# Patient Record
Sex: Female | Born: 1970 | Race: White | Hispanic: No | Marital: Married | Smoking: Former smoker
Health system: Southern US, Community
[De-identification: ages and names within clinical notes are randomized; demographics above are authoritative.]

---

## 2015-05-29 LAB — HM PAP SMEAR: HM PAP: NEGATIVE

## 2016-05-18 ENCOUNTER — Encounter: Payer: Self-pay | Admitting: Family Medicine

## 2016-05-18 ENCOUNTER — Ambulatory Visit (INDEPENDENT_AMBULATORY_CARE_PROVIDER_SITE_OTHER): Payer: BLUE CROSS/BLUE SHIELD | Admitting: Family Medicine

## 2016-05-18 ENCOUNTER — Ambulatory Visit (INDEPENDENT_AMBULATORY_CARE_PROVIDER_SITE_OTHER)
Admission: RE | Admit: 2016-05-18 | Discharge: 2016-05-18 | Disposition: A | Discharge status: Home / Self Care | Payer: BLUE CROSS/BLUE SHIELD | Source: Ambulatory Visit | Attending: Family Medicine | Admitting: Family Medicine

## 2016-05-18 VITALS — BP 140/88 | HR 96 | Temp 98.3°F | Resp 12 | Ht 65.5 in | Wt 148.5 lb

## 2016-05-18 DIAGNOSIS — M7989 Other specified soft tissue disorders: Secondary | ICD-10-CM | POA: Diagnosis not present

## 2016-05-18 DIAGNOSIS — G8929 Other chronic pain: Secondary | ICD-10-CM

## 2016-05-18 DIAGNOSIS — M545 Low back pain, unspecified: Secondary | ICD-10-CM

## 2016-05-18 DIAGNOSIS — D239 Other benign neoplasm of skin, unspecified: Secondary | ICD-10-CM | POA: Diagnosis not present

## 2016-05-18 DIAGNOSIS — M799 Soft tissue disorder, unspecified: Secondary | ICD-10-CM

## 2016-05-18 NOTE — Progress Notes (Signed)
Pre visit review using our clinic review tool, if applicable. No additional management support is needed unless otherwise documented below in the visit note. 

## 2016-05-18 NOTE — Progress Notes (Signed)
HPI:   [name] is a 45 y.o. female, who is here today to establish care with me.  Former PCP: N/A Last preventive routine visit: gyn preventive visit 2016 and has one last week of this month.  Otherwise healthy, she follows a healthful diet, she does not exercise regularly.  Concerns today: back pain and "cyst.  For about a year she has had intermittent left-sided lower back pain, usually it is exacerbated by yard work activities, nothing specific, and alleviated by rest. She has not tried OTC medications because usually it resolves spontaneously. Problem seems to be stable and if she doesn't engage in yardwork she does not have pain.  She denies any Hx injury or unusual level of activity.  Pain is not radiated, like "a catch" on her left lower back, then achy, 4-5/10 in intensity, with no associated LE numbness, tingling, urinary incontinence or retention, stool incontinence, or saddle anesthesia.   No rash or edema on area, fever, chills, or abnormal wt loss.   -She is also concerned because about 2 months ago she noted a "bump" on same area she has back pain while she was dropping/massaging back. She is not sure about growth, sometimes it hurts upon palpation, she is afraid that this lesion might be the reason she is having back pain.  She is currently followed recently with orthopedist because left ankle sprain. A couple weeks ago she tripped while she was putting [employer], she is tolerating a short prior to and was supposed to follow up with orthopedist yesterday but canceled appointment because she does not feel like something else can be done at this point and recovering well.   She also noted a lesion, "cyst", on bottom of her left foot, usually doesn't hurt, and has been stable for months now. She wonders if this is related with lesion on her back.      Review of Systems  Constitutional: Negative for activity change, appetite change,  fatigue, fever and unexpected weight change.  HENT: Negative for mouth sores, sore throat and trouble swallowing.   Respiratory: Negative for cough, shortness of breath and wheezing.   Cardiovascular: Negative for leg swelling.  Gastrointestinal: Negative for abdominal pain, blood in stool, nausea and vomiting.       Negative for changes in bowel habits or fecal incontinence.  Genitourinary: Negative for decreased urine volume, difficulty urinating, dysuria, hematuria, vaginal bleeding and vaginal discharge.       Negative for urine incontinence.  Musculoskeletal: Positive for back pain. Negative for arthralgias, joint swelling and neck pain.  Skin: Negative for color change and rash.  Neurological: Negative for weakness, numbness and headaches.  Hematological: Negative for adenopathy. Does not bruise/bleed easily.  Psychiatric/Behavioral: Negative for confusion. The patient is nervous/anxious.       No current outpatient prescriptions on file prior to visit.   No current facility-administered medications on file prior to visit.      No past medical history on file.: Non contributory. No Known Allergies  Family History  Problem Relation Age of Onset  . Arthritis Mother   . Hyperlipidemia Mother   . Hypertension Mother   . Arthritis Father   . Hyperlipidemia Father   . Hypertension Father     Social History   Social History  . Marital status: Married    Spouse name: N/A  . Number of children: N/A  . Years of education: N/A   Social History Main Topics  . Smoking status:  Former Smoker  . Smokeless tobacco: Never Used  . Alcohol use Yes  . Drug use: No  . Sexual activity: Yes   Other Topics Concern  . None   Social History Narrative  . None    Vitals:   05/18/16 0903  BP: 140/88  Pulse: 96  Resp: 12  Temp: 98.3 F (36.8 C)    Body mass index is 24.34 kg/m.      Physical Exam  Nursing note and vitals reviewed. Constitutional: She is oriented to  person, place, and time. She appears well-developed and well-nourished. She does not appear ill. No distress.  HENT:  Head: Atraumatic.  Mouth/Throat: Oropharynx is clear and moist and mucous membranes are normal.  Eyes: Conjunctivae and EOM are normal. Pupils are equal, round, and reactive to light.  Cardiovascular: Normal rate and regular rhythm.   No murmur heard. Respiratory: Effort normal and breath sounds normal. No respiratory distress.  GI: Soft. She exhibits no mass. There is no hepatomegaly. There is no tenderness.  Musculoskeletal: She exhibits no edema.  No significant deformity appreciated. + Left SI joint tenderness upon palpation, no pain of paraspinal muscles. Pain is not elicited with movement on exam table during examination. No local edema or erythema appreciated, no suspicious lesions.  Left foot ecchymosis toes and lateral malleolus edema Plantar aspect left foot and mid aspect medial there is a rounded lesion, 4-5 mm, mildly tender, more pronounce with dorsal flexion on big toe, not mobile.  Neurological: She is alert and oriented to person, place, and time. She has normal strength. Coordination and gait normal.  SLR negative bilateral.  Skin: Skin is warm. No rash noted. No erythema.     Lesion of concern left lower back a very mobile, rounded, defined borders nodular lesion, mildly tender, about 2.5 cm. Noted similar lesions on right lower back area, sacral, x 2 (1 cm and 5 mm), no tender. No skin changes.  Psychiatric: Her mood appears anxious.  Well groomed, good eye contact.      ASSESSMENT AND PLAN:     Colleen Herrera was seen today for establish care.  Diagnoses and all orders for this visit:    Chronic left-sided low back pain without sciatica  Possible causes discussed, musculoskeletal  most likely. Pain is localized on SI joint pain, explained that sometimes steroid injections in this joint may help and it is an option if pain gets worse, done by  ortho. . Because she is concerned about pain and relation to small mass in same area, plain imaging ordered. She can try OTC analgesics: patch or NSAID's as needed. Most important is to avoid activities that exacerbate pain. F/U as needed.   -     DG Lumbar Spine Complete; Future  Benign neoplasm of skin or subcutaneous tissue  ? Lipoma. Reassured, I do not think it is related to back pain and features do not suggest malignancy. She would like to have it remove or aspirated. Referral to surgery for consultation will be arranged.  -     Ambulatory referral to General Surgery  Soft tissue lesion of foot  ? Fibroma. Eventually lesion may need to be remove because may grow and cause discomfort with shoe. She will continue monitoring. F/U as needed.   -BP mildly elevated today, she reports BP at home 100/70. -Keep appt with her gynecologists for her female preventive care. -Consider a physical with fasting labs in a year or so.       Betty G. Martinique,  MD  [facility]. [place] office.

## 2016-05-18 NOTE — Patient Instructions (Addendum)
A few things to remember from today's visit:   Chronic left-sided low back pain without sciatica - Plan: DG Lumbar Spine Complete  Benign neoplasm of skin or subcutaneous tissue - Plan: Ambulatory referral to General Surgery  Over the counter Lidocaine patch may help. Be careful with activities that aggravate pain.  Ortho sometimes may give an injection in sacro iliac joint when pain seems to be localized.   Lesion on lower back seems a lipoma.  Please be sure medication list is accurate. If a new problem present, please set up appointment sooner than planned today.

## 2016-06-09 LAB — HM MAMMOGRAPHY

## 2017-05-09 ENCOUNTER — Ambulatory Visit: Payer: BLUE CROSS/BLUE SHIELD

## 2017-06-17 ENCOUNTER — Encounter: Payer: Self-pay | Admitting: *Deleted

## 2018-10-09 IMAGING — DX DG LUMBAR SPINE COMPLETE 4+V
5 series · 5 of 5 positions shown · non-contrast
Comparison: None in PACs

CLINICAL DATA: Chronic low back pain without sciatic symptoms.
Tenderness is localized over the SI joint on exam. No known injury.

EXAM:
LUMBAR SPINE - COMPLETE 4+ VIEW

[l-spine ap]
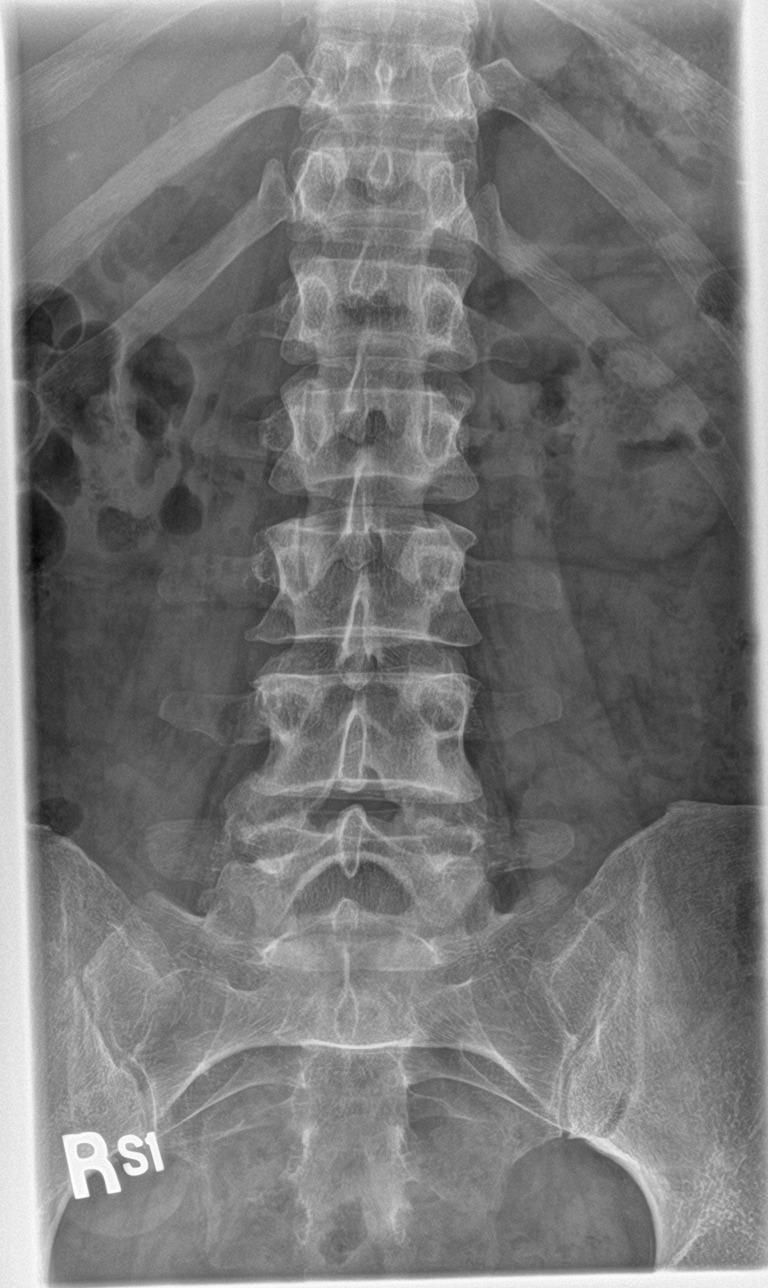

[l-spine obl (1 of 2)]
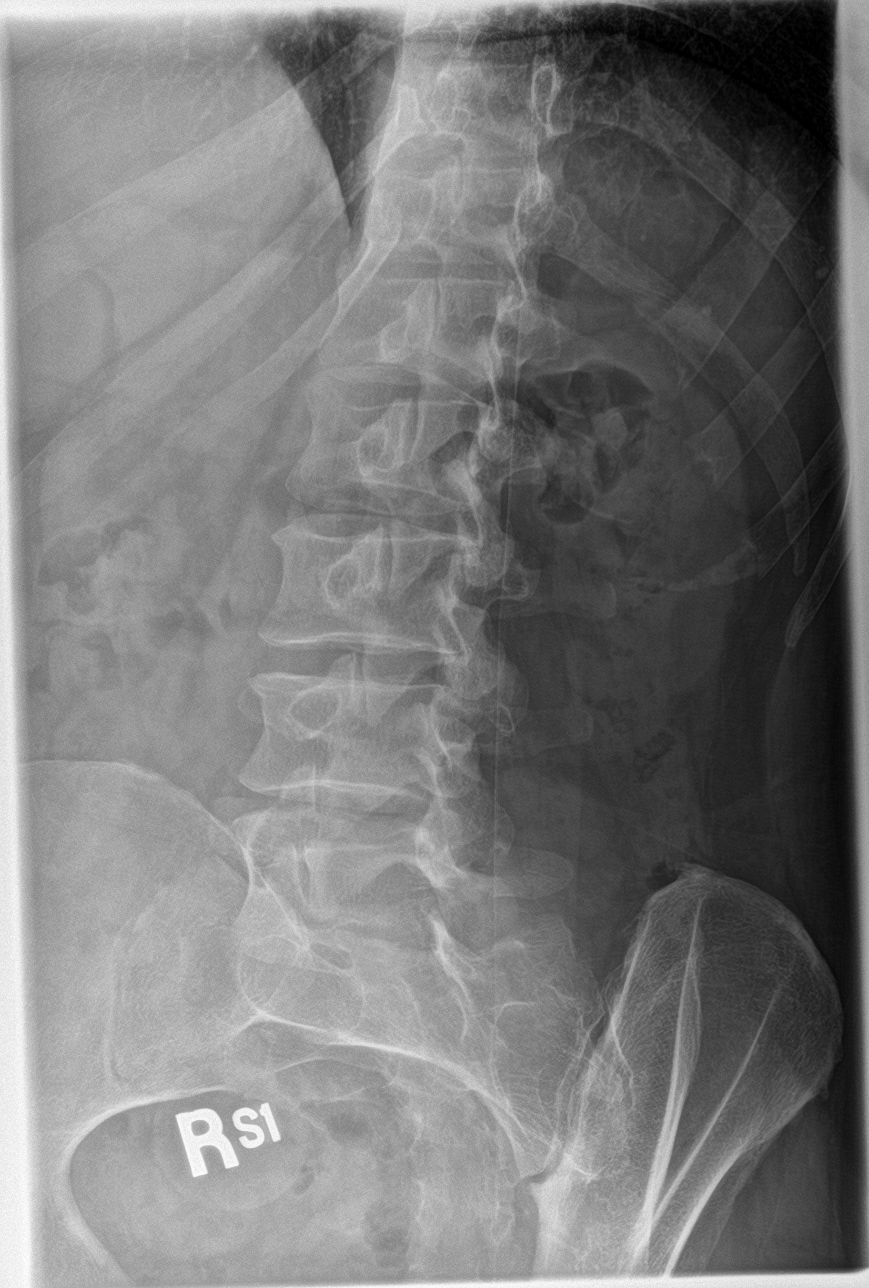

[l-spine obl (2 of 2)]
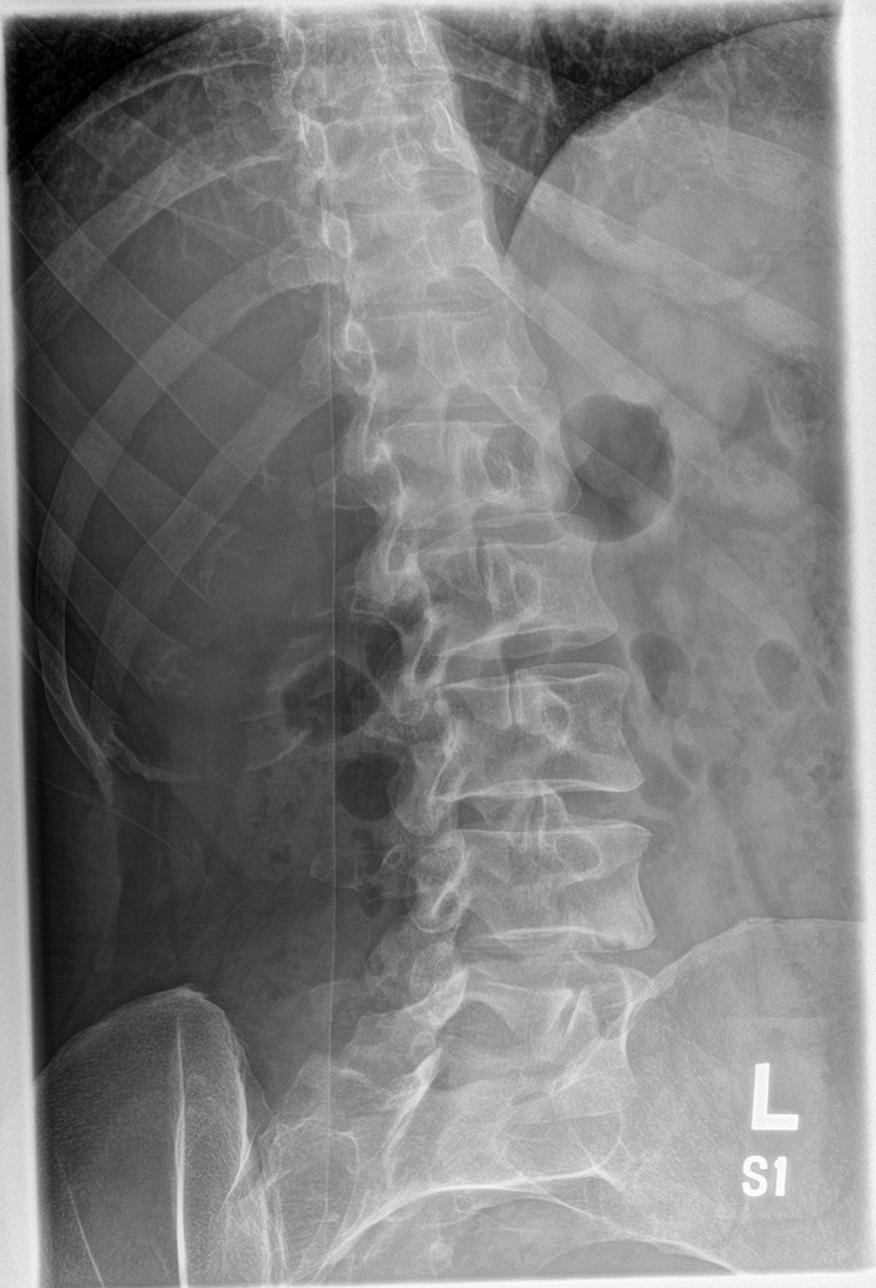

[l-spine lat]
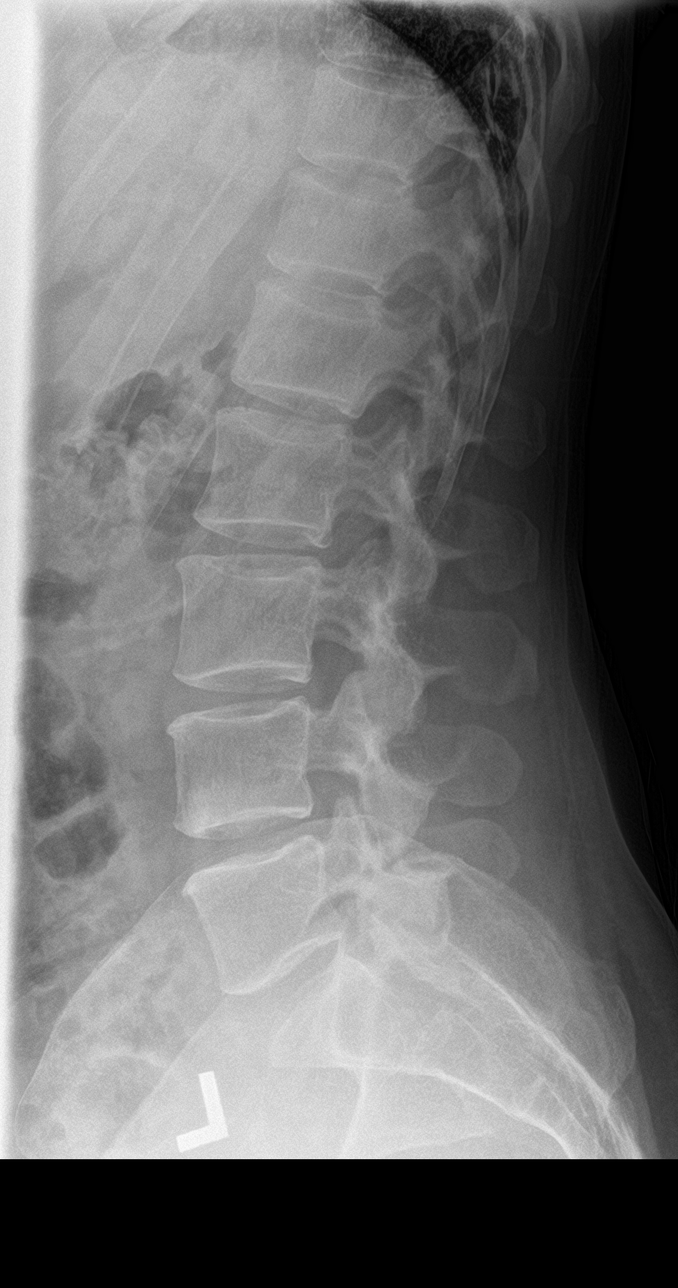

[l-spine spot]
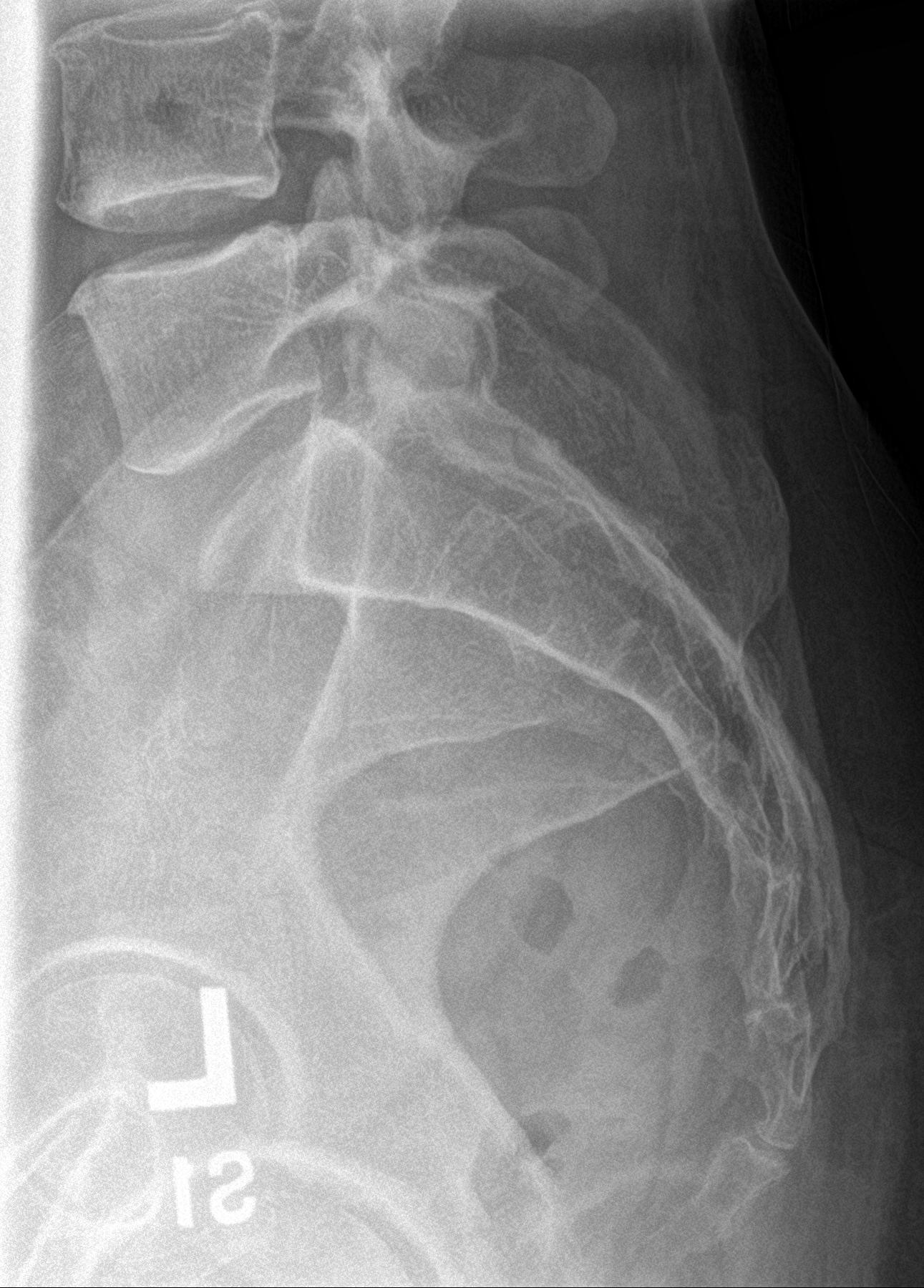

[5 of 5 positions shown; findings below may reference images not displayed]

FINDINGS: The lumbar vertebral bodies are preserved in height. The pedicles
and transverse processes are intact. The disc space heights are well
maintained. There is no significant facet joint hypertrophy. There
is no spondylolisthesis. The observed portions of the sacrum are
normal.
IMPRESSION: There is no acute or chronic bony abnormality of the lumbar spine.

## 2021-05-19 ENCOUNTER — Encounter (HOSPITAL_BASED_OUTPATIENT_CLINIC_OR_DEPARTMENT_OTHER): Payer: Self-pay | Admitting: Nurse Practitioner

## 2021-05-19 ENCOUNTER — Ambulatory Visit (INDEPENDENT_AMBULATORY_CARE_PROVIDER_SITE_OTHER): Payer: 59 | Admitting: Nurse Practitioner

## 2021-05-19 ENCOUNTER — Other Ambulatory Visit: Payer: Self-pay

## 2021-05-19 VITALS — BP 132/78 | HR 80 | Ht 65.0 in | Wt 145.6 lb

## 2021-05-19 DIAGNOSIS — Z23 Encounter for immunization: Secondary | ICD-10-CM

## 2021-05-19 DIAGNOSIS — Z13220 Encounter for screening for lipoid disorders: Secondary | ICD-10-CM

## 2021-05-19 DIAGNOSIS — Z8616 Personal history of COVID-19: Secondary | ICD-10-CM

## 2021-05-19 DIAGNOSIS — Z130 Encounter for screening for diseases of the blood and blood-forming organs and certain disorders involving the immune mechanism: Secondary | ICD-10-CM | POA: Diagnosis not present

## 2021-05-19 DIAGNOSIS — Z1329 Encounter for screening for other suspected endocrine disorder: Secondary | ICD-10-CM | POA: Diagnosis not present

## 2021-05-19 DIAGNOSIS — M722 Plantar fascial fibromatosis: Secondary | ICD-10-CM | POA: Insufficient documentation

## 2021-05-19 DIAGNOSIS — Z0000 Encounter for general adult medical examination without abnormal findings: Secondary | ICD-10-CM | POA: Diagnosis not present

## 2021-05-19 DIAGNOSIS — E559 Vitamin D deficiency, unspecified: Secondary | ICD-10-CM

## 2021-05-19 DIAGNOSIS — Z1211 Encounter for screening for malignant neoplasm of colon: Secondary | ICD-10-CM

## 2021-05-19 DIAGNOSIS — Z13228 Encounter for screening for other metabolic disorders: Secondary | ICD-10-CM

## 2021-05-19 DIAGNOSIS — Z1321 Encounter for screening for nutritional disorder: Secondary | ICD-10-CM

## 2021-05-19 MED ORDER — ZOSTER VAC RECOMB ADJUVANTED 50 MCG/0.5ML IM SUSR: 0.5000 mL | Freq: Once | INTRAMUSCULAR | 1 refills | Status: AC

## 2021-05-19 NOTE — Patient Instructions (Signed)
Thank you for choosing [place] at [facility] for your Primary Care needs. I am excited for the opportunity to partner with you to meet your health care goals. It was a pleasure meeting you today!  Recommendations from today's visit: I have sent the order to [employer] - this will be sent to your house and you will follow instructions to mail back to the company.  I have written the prescription for Shingrix. This can be taken to the pharmacy and completed. Be sure to ask about cost as some plans do not cover this well  I have ordered labs today. We will contact you with the results and let you know of any recommendations.  I recommend the COVID booster at any time.  Remember this takes two weeks to be fully active in your system so be safe until that time.  We have given you the tetanus booster today.   Information on diet, exercise, and health maintenance recommendations are listed below. This is information to help you be sure you are on track for optimal health and monitoring.   Please look over this and let us know if you have any questions or if you have completed any of the health maintenance outside of [place] so that we can be sure your records are up to date.  ___________________________________________________________ About Me: I am an Adult-Geriatric Nurse Practitioner with a background in caring for patients for more than 20 years with a strong intensive care background. I provide primary care and sports medicine services to patients age 70 and older within this office. My education had a strong focus on caring for the older adult population, which I am passionate about. I am also the director of the APP Fellowship with [facility] ([facility]   My desire is to provide you with the best service through preventive medicine and supportive care. I consider you a part of the medical team and value your input. I work diligently to ensure that you are heard and your needs are  met in a safe and effective manner. I want you to feel comfortable with me as your provider and want you to know that your health concerns are important to me.  For your information, our office hours are: Monday, Tuesday, and Thursday 8:00 AM - 5:00 PM Wednesday and Friday 8:00 AM - 12:00 PM.   In my time away from the office I am teaching new APP's within the system and am unavailable, but my partner, Dr. Burnard Bunting is in the office for emergent needs.   If you have questions or concerns, please call our office at [phone] or send [place] a MyChart message and we will respond as quickly as possible.  ____________________________________________________________ MyChart:  For all urgent or time sensitive needs we ask that you please call the office to avoid delays. Our number is [phone]. MyChart is not constantly monitored and due to the large volume of messages a day, replies may take up to 72 business hours.  MyChart Policy: MyChart allows for you to see your visit notes, after visit summary, provider recommendations, lab and tests results, make an appointment, request refills, and contact your provider or the office for non-urgent questions or concerns. Providers are seeing patients during normal business hours and do not have built in time to review MyChart messages.  We ask that you allow a minimum of 3 business days for responses to [employer]. For this reason, please do not send urgent requests through [place]. Please  call the office at [phone]. New and ongoing conditions may require a visit. We have virtual and in person visit available for your convenience.  Complex MyChart concerns may require a visit. Your provider may request you schedule a virtual or in person visit to ensure we are providing the best care possible. MyChart messages sent after 11:00 AM on Friday will not be received by the provider until Monday morning.    Lab and Test Results: You will receive your lab  and test results on MyChart as soon as they are completed and results have been sent by the lab or testing facility. Due to this service, you will receive your results BEFORE your provider.  I review lab and tests results each morning prior to seeing patients. Some results require collaboration with other providers to ensure you are receiving the most appropriate care. For this reason, we ask that you please allow a minimum of 3-5 business days from the time the ALL results have been received for your provider to receive and review lab and test results and contact you about these.  Most lab and test result comments from the provider will be sent through [place]. Your provider may recommend changes to the plan of care, follow-up visits, repeat testing, ask questions, or request an office visit to discuss these results. You may reply directly to this message or call the office at [phone] to provide information for the provider or set up an appointment. In some instances, you will be called with test results and recommendations. Please let us know if this is preferred and we will make note of this in your chart to provide this for you.    If you have not heard a response to your lab or test results in 5 business days from all results returning to [place], please call the office to let us know. We ask that you please avoid calling prior to this time unless there is an emergent concern. Due to high call volumes, this can delay the resulting process.  After Hours: For all non-emergency after hours needs, please call the office at [phone] and select the option to reach the on-call provider service. On-call services are shared between multiple [place] offices and therefore it will not be possible to speak directly with your provider. On-call providers may provide medical advice and recommendations, but are unable to provide refills for maintenance medications.  For all emergency or urgent medical needs  after normal business hours, we recommend that you seek care at the closest Urgent Care or Emergency Department to ensure appropriate treatment in a timely manner.  [facility] [place] has a 24 hour emergency room located on the ground floor for your convenience.   Urgent Concerns During the Business Day Providers are seeing patients from 8AM to [place] with a busy schedule and are most often not able to respond to non-urgent calls until the end of the day or the next business day. If you should have URGENT concerns during the day, please call and speak to the nurse or schedule a same day appointment so that we can address your concern without delay.   Thank you, again, for choosing me as your health care partner. I appreciate your trust and look forward to learning more about you.   Worthy Keeler, DNP, AGNP-c ___________________________________________________________  Health Maintenance Recommendations Screening Testing Mammogram Every 1 -2 years based on history and risk factors Starting at age 75 Pap Smear Ages 21-39 every 3 years Ages  30-65 every 5 years with HPV testing More frequent testing may be required based on results and history Colon Cancer Screening Every 1-10 years based on test performed, risk factors, and history Starting at age 49 Bone Density Screening Every 2-10 years based on history Starting at age 39 for women Recommendations for men differ based on medication usage, history, and risk factors AAA Screening One time ultrasound Men 36-67 years old who have every smoked Lung Cancer Screening Low Dose Lung CT every 12 months Age 8-80 years with a 30 pack-year smoking history who still smoke or who have quit within the last 15 years  Screening Labs Routine  Labs: Complete Blood Count (CBC), Complete Metabolic Panel (CMP), Cholesterol (Lipid Panel) Every 6-12 months based on history and medications May be recommended more frequently based on  current conditions or previous results Hemoglobin A1c Lab Every 3-12 months based on history and previous results Starting at age 31 or earlier with diagnosis of diabetes, high cholesterol, BMI >26, and/or risk factors Frequent monitoring for patients with diabetes to ensure blood sugar control Thyroid Panel (TSH w/ T3 & T4) Every 6 months based on history, symptoms, and risk factors May be repeated more often if on medication HIV One time testing for all patients 81 and older May be repeated more frequently for patients with increased risk factors or exposure Hepatitis C One time testing for all patients 30 and older May be repeated more frequently for patients with increased risk factors or exposure Gonorrhea, Chlamydia Every 12 months for all sexually active persons 13-24 years Additional monitoring may be recommended for those who are considered high risk or who have symptoms PSA Men 37-75 years old with risk factors Additional screening may be recommended from age 57-69 based on risk factors, symptoms, and history  Vaccine Recommendations Tetanus Booster All adults every 10 years Flu Vaccine All patients 6 months and older every year COVID Vaccine All patients 12 years and older Initial dosing with booster May recommend additional booster based on age and health history HPV Vaccine 2 doses all patients age 76-26 Dosing may be considered for patients over 26 Shingles Vaccine (Shingrix) 2 doses all adults 3 years and older Pneumonia (Pneumovax 23) All adults 37 years and older May recommend earlier dosing based on health history Pneumonia (Prevnar 45) All adults 10 years and older Dosed 1 year after Pneumovax 23  Additional Screening, Testing, and Vaccinations may be recommended on an individualized basis based on family history, health history, risk factors, and/or exposure.  __________________________________________________________  Diet Recommendations for All  Patients  I recommend that all patients maintain a diet low in saturated fats, carbohydrates, and cholesterol. While this can be challenging at first, it is not impossible and small changes can make big differences.  Things to try: Decreasing the amount of soda, sweet tea, and/or juice to one or less per day and replace with water While water is always the first choice, if you do not like water you may consider adding a water additive without sugar to improve the taste other sugar free drinks Replace potatoes with a brightly colored vegetable at dinner Use healthy oils, such as canola oil or olive oil, instead of butter or hard margarine Limit your bread intake to two pieces or less a day Replace regular pasta with low carb pasta options Bake, broil, or grill foods instead of frying Monitor portion sizes  Eat smaller, more frequent meals throughout the day instead of large meals  An important thing  to remember is, if you love foods that are not great for your health, you don't have to give them up completely. Instead, allow these foods to be a reward when you have done well. Allowing yourself to still have special treats every once in a while is a nice way to tell yourself thank you for working hard to keep yourself healthy.   Also remember that every day is a new day. If you have a bad day and "fall off the wagon", you can still climb right back up and keep moving along on your journey!  We have resources available to help you!  Some websites that may be helpful include: www.http://carter.biz/  Www.VeryWellFit.com _____________________________________________________________  Activity Recommendations for All Patients  I recommend that all adults get at least 20 minutes of moderate physical activity that elevates your heart rate at least 5 days out of the week.  Some examples include: Walking or jogging at a pace that allows you to carry on a conversation Cycling (stationary bike or  outdoors) Water aerobics Yoga Weight lifting Dancing If physical limitations prevent you from putting stress on your joints, exercise in a pool or seated in a chair are excellent options.  Do determine your MAXIMUM heart rate for activity: YOUR AGE - 220 = MAX HeartRate   Remember! Do not push yourself too hard.  Start slowly and build up your pace, speed, weight, time in exercise, etc.  Allow your body to rest between exercise and get good sleep. You will need more water than normal when you are exerting yourself. Do not wait until you are thirsty to drink. Drink with a purpose of getting in at least 8, 8 ounce glasses of water a day plus more depending on how much you exercise and sweat.    If you begin to develop dizziness, chest pain, abdominal pain, jaw pain, shortness of breath, headache, vision changes, lightheadedness, or other concerning symptoms, stop the activity and allow your body to rest. If your symptoms are severe, seek emergency evaluation immediately. If your symptoms are concerning, but not severe, please let us know so that we can recommend further evaluation.

## 2021-05-19 NOTE — Assessment & Plan Note (Signed)
No concerns or complaints today. Up-to-date on her eye exam. Tetanus shot- Will give today. States her last one was over ten years ago.  Recommend Shingrix vaccine due to her age; prescription given for her to take it to the pharmacy. Recommend Covid booster. Recommend Colorectal cancer screening due to her age. Patient agreeable to [facility]. Referral sent and information pamphlet given to the patient.

## 2021-05-19 NOTE — Progress Notes (Signed)
New Patient Office Visit  Subjective:  Patient ID: Colleen Herrera, female    DOB: 1971/04/16  Age: 50 y.o. MRN: [id number]  CC:  Chief Complaint  Patient presents with   Establish Care    No prior pcp in the last 3 years. Patient has been following with [facility]. She is up to date on her flu and mammogram. She has requested to have a colonoscopy ordered    HPI Colleen Herrera presents for establishing care and her annual physical. States she exercises 3-4 times a week. She does body pump and treadmill. Her diet is vegetarian and pescatarian. She denies any unusual headaches, chest pain or shortness of breath. She has not had any recent lab work. She has a GYN at [facility] and is up-to-date on her pap smear and mammogram. Her last mammogram was six months ago.   History reviewed. No pertinent past medical history.  Past Surgical History:  Procedure Laterality Date   CESAREAN SECTION     2007    Family History  Problem Relation Age of Onset   Arthritis Mother    Hyperlipidemia Mother    Hypertension Mother    Arthritis Father    Hyperlipidemia Father    Hypertension Father    AAA (abdominal aortic aneurysm) Paternal Grandfather     Social History   Socioeconomic History   Marital status: Married    Spouse name: Not on file   Number of children: Not on file   Years of education: Not on file   Highest education level: Not on file  Occupational History   Not on file  Tobacco Use   Smoking status: Former   Smokeless tobacco: Never   Tobacco comments:    Smoked in college   Vaping Use   Vaping Use: Never used  Substance and Sexual Activity   Alcohol use: Yes    Comment: 3-4 x week\   Drug use: No   Sexual activity: Yes  Other Topics Concern   Not on file  Social History Narrative   Not on file   Social Determinants of Health   Financial Resource Strain: Not on file  Food Insecurity: Not on file  Transportation Needs: Not on file   Physical Activity: Not on file  Stress: Not on file  Social Connections: Not on file  Intimate Partner Violence: Not on file    ROS Review of Systems  All other systems reviewed and are negative.  Objective:   Today's Vitals: BP 132/78   Pulse 80   Ht 5\' 5"  (1.651 m)   Wt 145 lb 9.6 oz (66 kg)   LMP  (Within Years) Comment: approx 1 year ago  SpO2 98%   BMI 24.23 kg/m   Physical Exam Vitals and nursing note reviewed.  Constitutional:      Appearance: Normal appearance.  HENT:     Head: Normocephalic.     Right Ear: Tympanic membrane, ear canal and external ear normal.     Left Ear: Tympanic membrane and ear canal normal.     Nose: Nose normal.     Mouth/Throat:     Mouth: Mucous membranes are moist.     Pharynx: Oropharynx is clear.  Eyes:     Extraocular Movements: Extraocular movements intact.     Conjunctiva/sclera: Conjunctivae normal.     Pupils: Pupils are equal, round, and reactive to light.  Cardiovascular:     Rate and Rhythm: Normal rate and regular rhythm.  Pulses: Normal pulses.     Heart sounds: Normal heart sounds.  Pulmonary:     Effort: Pulmonary effort is normal.     Breath sounds: Normal breath sounds.  Abdominal:     General: Bowel sounds are normal.     Palpations: Abdomen is soft.  Musculoskeletal:        General: Normal range of motion.     Cervical back: Normal range of motion.     Comments: Small cysts on the bottom of her feet.  Skin:    General: Skin is warm and dry.     Capillary Refill: Capillary refill takes more than 3 seconds.  Neurological:     Mental Status: She is alert and oriented to person, place, and time.  Psychiatric:        Mood and Affect: Mood normal.        Behavior: Behavior normal.    Assessment & Plan:   Problem List Items Addressed This Visit       Musculoskeletal and Integument   Plantar fascial fibromatosis of both feet    Small cysts present on both of her feet. Non-tender. Will continue to  monitor them.         Other   Encounter for medical examination to establish care - Primary    No concerns or complaints today. Up-to-date on her eye exam. Tetanus shot- Will give today. States her last one was over ten years ago.  Recommend Shingrix vaccine due to her age; prescription given for her to take it to the pharmacy. Recommend Covid booster. Recommend Colorectal cancer screening due to her age. Patient agreeable to Cologuard. Referral sent and information pamphlet given to the patient.       Other Visit Diagnoses     Screening for deficiency anemia       Relevant Orders   CBC with Differential/Platelet   Screening for lipid disorders       Relevant Orders   Lipid panel   Screening for thyroid disorder       Relevant Orders   TSH Rfx on Abnormal to Free T4   Screening for endocrine, nutritional, metabolic and immunity disorder       Relevant Orders   CBC with Differential/Platelet   Comprehensive metabolic panel   Hemoglobin A1c   Lipid panel   TSH Rfx on Abnormal to Free T4   Screening for colon cancer       Relevant Orders   Cologuard   Need for tetanus booster       Relevant Orders   Td : Tetanus/diphtheria >7yo Preservative  free (Completed)   Need for shingles vaccine       Relevant Medications   Zoster Vaccine Adjuvanted [facility]) injection   Vitamin D deficiency       Relevant Orders   VITAMIN D 25 Hydroxy (Vit-D Deficiency, Fractures)   Personal history of COVID-19           Outpatient Encounter Medications as of 05/19/2021  Medication Sig   Zoster Vaccine Adjuvanted [facility]) injection Inject 0.5 mLs into the muscle once for 1 dose. Repeat in 2-6 months. Please fax confirmation of vaccination to Worthy Keeler, DNP at [phone]   No facility-administered encounter medications on file as of 05/19/2021.    Follow-up: Return in about 1 year (around 05/19/2022) for CPE.   Tinnie Gens, Toney Reil, DNP STUDENT    Patient seen along with NP  student, Tinnie Gens, RN at today's visit. Portions of documentation and  assessment have been completed by the student listed.  Porfirio Oar, NP, have reviewed all documentation completed by the student. The documentation on 05/19/21 for the exam, diagnosis, procedures, and orders are all accurate and complete.  Porfirio Oar, NP,have personally seen and evaluated the patient during this encounter.  While the patient was in clinic, I reviewed the patient's medical history, the student's findings on physical examination, and the patient's diagnosis and treatment plan. All aspects of care were discussed with the the student and I agree with the information documented.   Zoriyah Scheidegger, Coralee Pesa, NP, DNp, AGNP-c [facility]

## 2021-05-19 NOTE — Assessment & Plan Note (Signed)
Small cysts present on both of her feet. Non-tender. Will continue to monitor them.

## 2021-05-20 ENCOUNTER — Telehealth (HOSPITAL_BASED_OUTPATIENT_CLINIC_OR_DEPARTMENT_OTHER): Payer: Self-pay

## 2021-05-20 LAB — CBC WITH DIFFERENTIAL/PLATELET
Basophils Absolute: 0.1 10*3/uL (ref 0.0–0.2)
Basos: 1 %
EOS (ABSOLUTE): 0.1 10*3/uL (ref 0.0–0.4)
Eos: 1 %
Hematocrit: 44.7 % (ref 34.0–46.6)
Hemoglobin: 15.5 g/dL (ref 11.1–15.9)
Immature Grans (Abs): 0 10*3/uL (ref 0.0–0.1)
Immature Granulocytes: 0 %
Lymphocytes Absolute: 2.6 10*3/uL (ref 0.7–3.1)
Lymphs: 37 %
MCH: 32.5 pg (ref 26.6–33.0)
MCHC: 34.7 g/dL (ref 31.5–35.7)
MCV: 94 fL (ref 79–97)
Monocytes Absolute: 0.5 10*3/uL (ref 0.1–0.9)
Monocytes: 7 %
Neutrophils Absolute: 3.8 10*3/uL (ref 1.4–7.0)
Neutrophils: 54 %
Platelets: 319 10*3/uL (ref 150–450)
RBC: 4.77 x10E6/uL (ref 3.77–5.28)
RDW: 11.6 % — ABNORMAL LOW (ref 11.7–15.4)
WBC: 7 10*3/uL (ref 3.4–10.8)

## 2021-05-20 LAB — COMPREHENSIVE METABOLIC PANEL
ALT: 18 IU/L (ref 0–32)
AST: 19 IU/L (ref 0–40)
Albumin/Globulin Ratio: 2.3 — ABNORMAL HIGH (ref 1.2–2.2)
Albumin: 5.1 g/dL — ABNORMAL HIGH (ref 3.8–4.9)
Alkaline Phosphatase: 117 IU/L (ref 44–121)
BUN/Creatinine Ratio: 14 (ref 9–23)
BUN: 9 mg/dL (ref 6–24)
Bilirubin Total: 0.3 mg/dL (ref 0.0–1.2)
CO2: 27 mmol/L (ref 20–29)
Calcium: 9.9 mg/dL (ref 8.7–10.2)
Chloride: 101 mmol/L (ref 96–106)
Creatinine, Ser: 0.65 mg/dL (ref 0.57–1.00)
Globulin, Total: 2.2 g/dL (ref 1.5–4.5)
Glucose: 115 mg/dL — ABNORMAL HIGH (ref 70–99)
Potassium: 4.3 mmol/L (ref 3.5–5.2)
Sodium: 141 mmol/L (ref 134–144)
Total Protein: 7.3 g/dL (ref 6.0–8.5)
eGFR: 107 mL/min/{1.73_m2} (ref >59)

## 2021-05-20 LAB — LIPID PANEL
Chol/HDL Ratio: 2.6 ratio (ref 0.0–4.4)
Cholesterol, Total: 201 mg/dL — ABNORMAL HIGH (ref 100–199)
HDL: 77 mg/dL (ref >39)
LDL Chol Calc (NIH): 106 mg/dL — ABNORMAL HIGH (ref 0–99)
Triglycerides: 102 mg/dL (ref 0–149)
VLDL Cholesterol Cal: 18 mg/dL (ref 5–40)

## 2021-05-20 LAB — TSH RFX ON ABNORMAL TO FREE T4: TSH: 1.45 u[IU]/mL (ref 0.450–4.500)

## 2021-05-20 LAB — HEMOGLOBIN A1C
Est. average glucose Bld gHb Est-mCnc: 105 mg/dL
Hgb A1c MFr Bld: 5.3 % (ref 4.8–5.6)

## 2021-05-20 LAB — VITAMIN D 25 HYDROXY (VIT D DEFICIENCY, FRACTURES): Vit D, 25-Hydroxy: 41.4 ng/mL (ref 30.0–100.0)

## 2021-05-20 NOTE — Telephone Encounter (Signed)
-----   Message from Orma Render, NP sent at 05/20/2021  8:10 AM EST ----- CBC: Your red and white blood counts are normal. There is no sign of infection, There is no sign of anemia present.  Recommendations: Your RDW is just slightly low, but nothing concerning. We will monitor this in the future to see if it is trending, but with all of the other values within normal limits, no concerns at this time.   CMP: Your blood sugar is abnormal - it was higher than expected. If you were not fasting, this is normal and no concerns.  Your kidney function is normal. Your liver function is normal. Your electrolytes are normal. Recommendations: If you were fasting at the time of your labs, your blood sugar is higher than expected, but most likely you had eaten, which makes this value normal- especially with the A1c results (see below).  A1c: Your A1c:5.3 %. Your goal A1c is < 5.7%. Hemoglobin A1c gives [place] an average blood sugar over the last three months. This tells me your average blood sugar is 105 mg/dL. At this time, your blood sugars are well controlled.  Recommendations: No changes or concerns present. Keep up the great work.   Lipids (Cholesterol): Your overall cholesterol is abnormal - slightly elevated. Your LDL (bad cholesterol) is abnormal - slightly elevated. Your HDL (good cholesterol) is Fantastic! . Your triglycerides are normal. Recommendations: Your overall cholesterol does show elevated, but your good cholesterol levels are so high that this is bumping up the number. We would like your bad cholesterol to be a little lower (less than 100 is goal), but your good cholesterol helps to cancel this out and protect you against complications with high cholesterol. I recommend monitoring your saturated fats and increase fiber and fresh vegetables in your diet (if you don't already). No additional concerns or changes at this time.   Your thyroid testing is normal.  Recommendations: No changes or concerns at  this time.   Your vitamin D level is Normal.  Recommendations: No changes or concerns at this time.  Keep up the great work! Colleen Herrera

## 2021-05-20 NOTE — Telephone Encounter (Signed)
Patient is aware and agreeable to normal labs results Patient was surprised that cholesterol was abnormal being that she is a vegetarian and eats very clean I explained to her that overall her cholesterol and health is great, there is no need to make any drastic changes She was comforted and thanked me

## 2021-06-10 LAB — COLOGUARD: COLOGUARD: NEGATIVE

## 2021-06-10 NOTE — Progress Notes (Signed)
Cologuard results are normal. We will plan to repeat in 3 years unless there are any issues.   Have a great day! SaraBeth

## 2021-07-09 ENCOUNTER — Other Ambulatory Visit: Payer: Self-pay

## 2021-07-09 ENCOUNTER — Ambulatory Visit (INDEPENDENT_AMBULATORY_CARE_PROVIDER_SITE_OTHER): Payer: 59 | Admitting: Nurse Practitioner

## 2021-07-09 DIAGNOSIS — R300 Dysuria: Secondary | ICD-10-CM | POA: Diagnosis not present

## 2021-07-09 LAB — POCT URINALYSIS DIP (CLINITEK)
Bilirubin, UA: NEGATIVE
Glucose, UA: NEGATIVE mg/dL
Ketones, POC UA: NEGATIVE mg/dL
Nitrite, UA: NEGATIVE
POC PROTEIN,UA: NEGATIVE
Spec Grav, UA: 1.01 (ref 1.010–1.025)
Urobilinogen, UA: 0.2 E.U./dL
pH, UA: 5.5 (ref 5.0–8.0)

## 2021-07-09 MED ORDER — FLUCONAZOLE 150 MG PO TABS: 150.0000 mg | ORAL_TABLET | Freq: Once | ORAL | 1 refills | Status: AC

## 2021-07-09 MED ORDER — NITROFURANTOIN MONOHYD MACRO 100 MG PO CAPS: 100.0000 mg | ORAL_CAPSULE | Freq: Two times a day (BID) | ORAL | 0 refills | Status: AC

## 2021-07-09 NOTE — Progress Notes (Signed)
Colleen Herrera comes in today with concerns for symptoms of dysuria. UA performed per day per protocol. Results reviewed with provider and standing orders were initiated for additional testing and treatment.   Recommend: Macrobid 100 mg twice a day for 5 days. Increase water intake. Do not hold urine. Monitor for new or worsening symptoms and report immediately. Complete all of the prescribed medication in it's entirety and do not skip doses or stop Colleen Herrera.  Follow-up if symptoms fail to improve.

## 2021-07-15 LAB — URINE CULTURE

## 2021-09-21 ENCOUNTER — Telehealth (HOSPITAL_BASED_OUTPATIENT_CLINIC_OR_DEPARTMENT_OTHER): Payer: Self-pay | Admitting: Nurse Practitioner

## 2021-09-21 NOTE — Telephone Encounter (Signed)
Received fax from [facility] nurse triage ?Statoign pt was having uti symptoms on 3/12. LVM to cb to see if pt wanted to sch appt for this issue. ?Please advise. ?

## 2022-01-06 ENCOUNTER — Other Ambulatory Visit (HOSPITAL_BASED_OUTPATIENT_CLINIC_OR_DEPARTMENT_OTHER): Payer: Self-pay

## 2022-01-06 MED ORDER — ZOSTER VAC RECOMB ADJUVANTED 50 MCG/0.5ML IM SUSR
INTRAMUSCULAR | 1 refills | Status: AC
  Filled 2022-01-06: qty 0.5, 1d supply, fill #0
